# Patient Record
Sex: Female | Born: 1975 | Race: White | Hispanic: No | Marital: Single | Smoking: Current every day smoker
Health system: Southern US, Community
[De-identification: ages and names within clinical notes are randomized; demographics above are authoritative.]

## PROBLEM LIST (undated history)

## (undated) DIAGNOSIS — F419 Anxiety disorder, unspecified: Secondary | ICD-10-CM

## (undated) DIAGNOSIS — R32 Unspecified urinary incontinence: Secondary | ICD-10-CM

## (undated) DIAGNOSIS — F329 Major depressive disorder, single episode, unspecified: Secondary | ICD-10-CM

## (undated) DIAGNOSIS — M5430 Sciatica, unspecified side: Secondary | ICD-10-CM

## (undated) DIAGNOSIS — R600 Localized edema: Secondary | ICD-10-CM

## (undated) DIAGNOSIS — Z8759 Personal history of other complications of pregnancy, childbirth and the puerperium: Secondary | ICD-10-CM

## (undated) DIAGNOSIS — F32A Depression, unspecified: Secondary | ICD-10-CM

## (undated) DIAGNOSIS — G4700 Insomnia, unspecified: Secondary | ICD-10-CM

## (undated) HISTORY — DX: Insomnia, unspecified: G47.00

## (undated) HISTORY — DX: Localized edema: R60.0

## (undated) HISTORY — DX: Anxiety disorder, unspecified: F41.9

## (undated) HISTORY — DX: Depression, unspecified: F32.A

## (undated) HISTORY — DX: Major depressive disorder, single episode, unspecified: F32.9

## (undated) HISTORY — DX: Sciatica, unspecified side: M54.30

## (undated) HISTORY — PX: LAPAROSCOPIC GASTRIC BANDING: SHX1100

## (undated) HISTORY — DX: Personal history of other complications of pregnancy, childbirth and the puerperium: Z87.59

## (undated) HISTORY — DX: Unspecified urinary incontinence: R32

---
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
## 2010-01-22 ENCOUNTER — Encounter: Payer: Self-pay | Admitting: Pulmonary Disease

## 2010-01-26 ENCOUNTER — Encounter
Admission: RE | Admit: 2010-01-26 | Discharge: 2010-03-05 | Payer: Self-pay | Source: Home / Self Care | Admitting: Surgery

## 2010-02-03 ENCOUNTER — Ambulatory Visit (HOSPITAL_COMMUNITY): Admission: RE | Admit: 2010-02-03 | Discharge: 2010-02-03 | Payer: Self-pay | Admitting: Surgery

## 2010-02-04 ENCOUNTER — Ambulatory Visit (HOSPITAL_COMMUNITY): Admission: RE | Admit: 2010-02-04 | Discharge: 2010-02-04 | Payer: Self-pay | Admitting: Surgery

## 2010-02-25 ENCOUNTER — Ambulatory Visit (HOSPITAL_BASED_OUTPATIENT_CLINIC_OR_DEPARTMENT_OTHER): Admission: RE | Admit: 2010-02-25 | Discharge: 2010-02-25 | Payer: Self-pay | Admitting: Surgery

## 2010-02-25 ENCOUNTER — Encounter: Payer: Self-pay | Admitting: Pulmonary Disease

## 2010-02-28 ENCOUNTER — Ambulatory Visit: Payer: Self-pay | Admitting: Internal Medicine

## 2010-03-25 ENCOUNTER — Ambulatory Visit: Payer: Self-pay | Admitting: Surgery

## 2010-03-25 DIAGNOSIS — G2581 Restless legs syndrome: Secondary | ICD-10-CM

## 2010-03-25 DIAGNOSIS — G4733 Obstructive sleep apnea (adult) (pediatric): Secondary | ICD-10-CM | POA: Insufficient documentation

## 2010-03-25 DIAGNOSIS — J309 Allergic rhinitis, unspecified: Secondary | ICD-10-CM | POA: Insufficient documentation

## 2010-03-26 ENCOUNTER — Encounter
Admission: RE | Admit: 2010-03-26 | Discharge: 2010-06-16 | Payer: Self-pay | Source: Home / Self Care | Attending: Surgery | Admitting: Surgery

## 2010-04-07 ENCOUNTER — Ambulatory Visit (HOSPITAL_COMMUNITY): Admission: RE | Admit: 2010-04-07 | Discharge: 2010-04-08 | Payer: Self-pay | Admitting: Surgery

## 2010-04-08 ENCOUNTER — Ambulatory Visit: Payer: Self-pay | Admitting: Vascular Surgery

## 2010-04-08 ENCOUNTER — Encounter (INDEPENDENT_AMBULATORY_CARE_PROVIDER_SITE_OTHER): Payer: Self-pay | Admitting: Surgery

## 2010-04-15 ENCOUNTER — Encounter: Payer: Self-pay | Admitting: Pulmonary Disease

## 2010-04-21 ENCOUNTER — Encounter
Admission: RE | Admit: 2010-04-21 | Discharge: 2010-04-21 | Payer: Self-pay | Source: Home / Self Care | Attending: Surgery | Admitting: Surgery

## 2010-05-26 ENCOUNTER — Ambulatory Visit: Payer: Self-pay | Admitting: Pulmonary Disease

## 2010-07-07 NOTE — Assessment & Plan Note (Signed)
Summary: cpap setup, sleep study done at [facility]   Visit Type:  Initial Consult Copy to:  Dr. Luretha Murphy Primary Provider/Referring Provider:  Dr. Sindy Guadeloupe  CC:  Pt here for sleep consult.  History of Present Illness: 35 yo female for sleep evaluation.  She is being evaluated for lap band surgery.  During this evaluation she had a sleep study.  This showed  severe obstructive sleep apnea.  She followed a split night protocol.  She had very good response to CPAP with evidence for REM rebound.  She was titrated up to 20 cm H2O, but appeared to have reasonable control with CPAP at 15 cm H2O.  With CPAP at 15 cm H2O she was observed in REM and supine sleep.  She has not been started on CPAP yet.  She has noticed problem with her sleep for years.  She does snore, and has been told that she stops breathing while asleep.  She will sometimes wake up to find that she is sitting on the side of her bed gasping for air.  She always feels tired during the day.    She goes to bed at 9pm, and falls asleep quickly.  She wakes up several times during the night to use the bathroom.  She gets out of bed at 630 am, and feels tired still.  She will occasionally get headaches in the morning.  She drools a lot while asleep.  She will always talk in her sleep.  She clenches her teeth while asleep at times.  She has been using ambien intermittently, but this does not seem to help as much as before.    She has a history of restless legs syndrome.  She uses mirapex as needed.  This does not occur too often, but seemed to be more frequent when she was pregnant.  She denies sleep hallucinations, sleep paralysis, or cataplexy.  She denies nightmares, or sleep walking. She is not using anything to help her stay awake.  There is no history of thyroid disease.  Her mood has been okay.  She has been using celexa.  Epworth score is 16 out of 24.  Preventive Screening-Counseling & Management  Alcohol-Tobacco  Alcohol drinks/day: 0     Smoking Status: quit     Packs/Day: 1.0     Year Started: 1995     Year Quit: 2010  Current Medications (verified): 1)  Celexa 40 Mg Tabs (Citalopram Hydrobromide) .... Take 1 Tablet By Mouth Once A Day 2)  Robinul-Forte 2 Mg Tabs (Glycopyrrolate) .... Take 1 Tablet By Mouth Two Times A Day 3)  Ibuprofen 800 Mg Tabs (Ibuprofen) .... As Needed For Back Pain 4)  Ambien 5 Mg Tabs (Zolpidem Tartrate) .... Take 1 Tab By Mouth At Bedtime As Needed For Sleep  Allergies (verified): No Known Drug Allergies  Past History:  Past Medical History: Allergic Rhinitis Osteoarthritis Obesity Low back pain Depression Obstructive sleep apnea      - PSG 02/25/10>>AHI 127.5, CPAP 15 cm  Past Surgical History: Tonsillectomy 1990 Right knee 1993  Family History: Paternal family history unknown Family History Lung Cancer-maternal great grandmother Pancreatic cancer-maternal grandfather Mother - OSA  Social History: Single lives with parents and child.  Works as a Armed forces logistics/support/administrative officer.  Former smoker.  Smoking Status:  quit Packs/Day:  1.0 Alcohol drinks/day:  0  Review of Systems       The patient complains of shortness of breath with activity and hand/feet swelling.  The patient denies shortness  of breath at rest, productive cough, non-productive cough, coughing up blood, chest pain, irregular heartbeats, acid heartburn, indigestion, loss of appetite, weight change, abdominal pain, difficulty swallowing, sore throat, tooth/dental problems, headaches, nasal congestion/difficulty breathing through nose, sneezing, itching, ear ache, anxiety, depression, joint stiffness or pain, rash, change in color of mucus, and fever.    Vital Signs:  Patient profile:   35 year old female Height:      70 inches Weight:      383.6 pounds BMI:     55.24 O2 Sat:      98 % on Room air Temp:     98.4 degrees F oral Pulse rate:   83 / minute BP sitting:   124 / 72  (left arm) Cuff  size:   regular  Vitals Entered By: Zackery Barefoot CMA (March 25, 2010 2:33 PM)  O2 Flow:  Room air CC: Pt here for sleep consult Comments Medications reviewed with patient Verified contact number and pharmacy with patient Zackery Barefoot CMA  March 25, 2010 2:33 PM    Physical Exam  General:  normal appearance, healthy appearing, and obese.   Eyes:  PERRLA and EOMI.   Ears:  TMs intact and clear with normal canals Nose:  no deformity, discharge, inflammation, or lesions Mouth:  MP 3, no exudate Neck:  no JVD.   Chest Wall:  no deformities noted Lungs:  clear bilaterally to auscultation and percussion Heart:  regular rate and rhythm, S1, S2 without murmurs, rubs, gallops, or clicks Abdomen:  bowel sounds positive; abdomen soft and non-tender without masses, or organomegaly Msk:  no deformity or scoliosis noted with normal posture Pulses:  pulses normal Extremities:  no clubbing, cyanosis, edema, or deformity noted Neurologic:  normal CN II-XII and strength normal.   Skin:  no rashes Cervical Nodes:  no significant adenopathy Psych:  alert and cooperative; normal mood and affect; normal attention span and concentration   Impression & Recommendations:  Problem # 1:  OBSTRUCTIVE SLEEP APNEA (ICD-327.23) Her sleep study was reviewed with her.  I explained how sleep apnea can affect her health.  Driving precautions, and need for weight loss were discussed.  Treatment options were reviewed.  Given the severity of her sleep apnea, and her response to CPAP, I have recommended that she use CPAP.  Will start her on CPAP at 15 cm H2O and monitor for her clinical response.  Explained to her that her CPAP needs will change as she loses weight from lap band surgery.  Problem # 2:  MORBID OBESITY (ICD-278.01)  She is scheduled for lap band surgery in November.  She will need to have close monitoring of her oxygenation during the peri-operative period.  She will need to continue with  CPAP during the peri-operative period.  Pulmonary service can be available post-op if needed.  Problem # 3:  RESTLESS LEG SYNDROME (ICD-333.94)  She has mild symptoms, and uses as needed mirapex.  Will monitor for now.  Explained how her celexa could also be contributing to her leg symptoms.  Medications Added to Medication List This Visit: 1)  Celexa 40 Mg Tabs (Citalopram hydrobromide) .... Take 1 tablet by mouth once a day 2)  Robinul-forte 2 Mg Tabs (Glycopyrrolate) .... Take 1 tablet by mouth two times a day 3)  Ibuprofen 800 Mg Tabs (Ibuprofen) .... As needed for back pain 4)  Ambien 5 Mg Tabs (Zolpidem tartrate) .... Take 1 tab by mouth at bedtime as needed for sleep 5)  Mirapex  0.25 Mg Tabs (Pramipexole dihydrochloride) .... At bedtime as needed for legs  Complete Medication List: 1)  Celexa 40 Mg Tabs (Citalopram hydrobromide) .... Take 1 tablet by mouth once a day 2)  Robinul-forte 2 Mg Tabs (Glycopyrrolate) .... Take 1 tablet by mouth two times a day 3)  Ibuprofen 800 Mg Tabs (Ibuprofen) .... As needed for back pain 4)  Ambien 5 Mg Tabs (Zolpidem tartrate) .... Take 1 tab by mouth at bedtime as needed for sleep 5)  Mirapex 0.25 Mg Tabs (Pramipexole dihydrochloride) .... At bedtime as needed for legs  Other Orders: Consultation Level IV (16109) DME Referral (DME)  Patient Instructions: 1)  Will set up CPAP machine at 15 cm water pressure 2)  Good luck with surgery 3)  Follow up in 2 months   Immunization History:  Influenza Immunization History:    Influenza:  historical (03/02/2010)

## 2010-07-07 NOTE — Letter (Signed)
Summary: [facility]   Imported By: Sherian Rein 04/08/2010 11:30:46  _____________________________________________________________________  External Attachment:    Type:   Image     Comment:   External Document

## 2010-07-07 NOTE — Letter (Signed)
Summary: CMN for CPAP Supplies/[facility]  CMN for CPAP Supplies/[facility]   Imported By: Sherian Rein 04/20/2010 11:46:38  _____________________________________________________________________  External Attachment:    Type:   Image     Comment:   External Document

## 2010-08-18 LAB — DIFFERENTIAL
Eosinophils Absolute: 0 10*3/uL (ref 0.0–0.7)
Eosinophils Relative: 0 % (ref 0–5)
Lymphocytes Relative: 13 % (ref 12–46)
Lymphs Abs: 1.8 10*3/uL (ref 0.7–4.0)
Monocytes Relative: 7 % (ref 3–12)
Neutrophils Relative %: 80 % — ABNORMAL HIGH (ref 43–77)

## 2010-08-18 LAB — CBC
HCT: 37.8 % (ref 36.0–46.0)
Hemoglobin: 13 g/dL (ref 12.0–15.0)
MCH: 29.1 pg (ref 26.0–34.0)
MCHC: 34.5 g/dL (ref 30.0–36.0)
MCV: 84.5 fL (ref 78.0–100.0)
Platelets: 268 10*3/uL (ref 150–400)
RBC: 4.47 MIL/uL (ref 3.87–5.11)
RDW: 14.1 % (ref 11.5–15.5)
WBC: 13.7 10*3/uL — ABNORMAL HIGH (ref 4.0–10.5)

## 2010-08-18 LAB — HEMOGLOBIN AND HEMATOCRIT, BLOOD
HCT: 41.1 % (ref 36.0–46.0)
Hemoglobin: 14.3 g/dL (ref 12.0–15.0)

## 2010-08-19 LAB — CBC
HCT: 42.8 % (ref 36.0–46.0)
MCH: 28.9 pg (ref 26.0–34.0)
MCV: 83.8 fL (ref 78.0–100.0)
Platelets: 289 10*3/uL (ref 150–400)
RBC: 5.1 MIL/uL (ref 3.87–5.11)
WBC: 11.5 10*3/uL — ABNORMAL HIGH (ref 4.0–10.5)

## 2010-08-19 LAB — COMPREHENSIVE METABOLIC PANEL
Alkaline Phosphatase: 62 U/L (ref 39–117)
BUN: 14 mg/dL (ref 6–23)
Chloride: 101 mEq/L (ref 96–112)
Creatinine, Ser: 0.64 mg/dL (ref 0.4–1.2)
GFR calc non Af Amer: >60 mL/min (ref >60)
Glucose, Bld: 66 mg/dL — ABNORMAL LOW (ref 70–99)
Potassium: 4.4 mEq/L (ref 3.5–5.1)
Total Bilirubin: 0.4 mg/dL (ref 0.3–1.2)

## 2010-08-19 LAB — PREGNANCY, URINE: Preg Test, Ur: NEGATIVE

## 2010-08-19 LAB — DIFFERENTIAL
Basophils Absolute: 0 10*3/uL (ref 0.0–0.1)
Basophils Relative: 0 % (ref 0–1)
Lymphocytes Relative: 20 % (ref 12–46)
Neutro Abs: 8.4 10*3/uL — ABNORMAL HIGH (ref 1.7–7.7)
Neutrophils Relative %: 73 % (ref 43–77)

## 2010-08-19 LAB — SURGICAL PCR SCREEN
MRSA, PCR: NEGATIVE
Staphylococcus aureus: POSITIVE — AB

## 2010-10-27 ENCOUNTER — Encounter (INDEPENDENT_AMBULATORY_CARE_PROVIDER_SITE_OTHER): Payer: Self-pay | Admitting: Surgery

## 2011-01-15 ENCOUNTER — Encounter (INDEPENDENT_AMBULATORY_CARE_PROVIDER_SITE_OTHER): Payer: Self-pay

## 2011-01-15 ENCOUNTER — Ambulatory Visit (INDEPENDENT_AMBULATORY_CARE_PROVIDER_SITE_OTHER): Payer: BC Managed Care – PPO | Admitting: Physician Assistant

## 2011-01-15 VITALS — BP 138/86 | Ht 70.0 in | Wt 320.0 lb

## 2011-01-15 DIAGNOSIS — Z4651 Encounter for fitting and adjustment of gastric lap band: Secondary | ICD-10-CM

## 2011-01-15 NOTE — Patient Instructions (Signed)
Take clear liquids for the next 48 hours. Thin protein shakes are ok to start on Saturday evening. Call us if you have persistent vomiting or regurgitation, night cough or reflux symptoms. Return as scheduled or sooner if you notice no changes in hunger/portion sizes.   

## 2011-01-15 NOTE — Progress Notes (Signed)
  HISTORY: Amanda Fitzpatrick is a 35 y.o.female who received an AP-Large lap-band in November 2011 by Dr. Daphine Deutscher. She comes in today with increased hunger and portion sizes. She has no complaints of obstruction.  VITAL SIGNS: Filed Vitals:   01/15/11 1636  BP: 138/86    PHYSICAL EXAM: Physical exam reveals a very well-appearing 35 y.o.female in no apparent distress Neurologic: Awake, alert, oriented Psych: Bright affect, conversant Respiratory: Breathing even and unlabored. No stridor or wheezing Abdomen: Soft, nontender, nondistended to palpation. Incisions well-healed. No incisional hernias. Port easily palpated. Extremities: Atraumatic, good range of motion.  ASSESMENT: 35 y.o.  female  s/p AP-Large lap-band.   PLAN: The patient's port was accessed with a 20G Huber needle without difficulty. Clear fluid was aspirated and 0.5 mL saline was added to the port. The patient was able to swallow water without difficulty following the procedure and was instructed to take clear liquids for the next 24-48 hours and advance slowly as tolerated.

## 2011-02-19 ENCOUNTER — Encounter (INDEPENDENT_AMBULATORY_CARE_PROVIDER_SITE_OTHER): Payer: BC Managed Care – PPO

## 2011-02-26 ENCOUNTER — Ambulatory Visit (INDEPENDENT_AMBULATORY_CARE_PROVIDER_SITE_OTHER): Payer: BC Managed Care – PPO | Admitting: Physician Assistant

## 2011-02-26 ENCOUNTER — Encounter (INDEPENDENT_AMBULATORY_CARE_PROVIDER_SITE_OTHER): Payer: Self-pay

## 2011-02-26 VITALS — BP 116/74 | HR 74 | Temp 96.2°F | Resp 14 | Ht 70.0 in | Wt 313.2 lb

## 2011-02-26 DIAGNOSIS — Z4651 Encounter for fitting and adjustment of gastric lap band: Secondary | ICD-10-CM

## 2011-02-26 NOTE — Progress Notes (Signed)
  HISTORY: Amanda Fitzpatrick is a 35 y.o.female who received an AP-Large lap-band in November 2011 by Dr. Daphine Deutscher. She has no complaints of regurgitation or reflux but about twice a month has a burning sensation in the epigastrium during eating which self-resolves. She still finds herself hungry and wanting smaller portion sizes.  VITAL SIGNS: Filed Vitals:   02/26/11 1636  BP: 116/74  Pulse: 74  Temp: 96.2 F (35.7 C)  Resp: 14    PHYSICAL EXAM: Physical exam reveals a very well-appearing 35 y.o.female in no apparent distress Neurologic: Awake, alert, oriented Psych: Bright affect, conversant Respiratory: Breathing even and unlabored. No stridor or wheezing Abdomen: Soft, nontender, nondistended to palpation. Incisions well-healed. No incisional hernias. Port easily palpated. Extremities: Atraumatic, good range of motion.  ASSESMENT: 35 y.o.  female  s/p AP-Large lap-band.   PLAN: The patient's port was accessed with a 20G Huber needle without difficulty. Clear fluid was aspirated and 0.5 mL saline was added to the port. The patient was able to swallow water without difficulty following the procedure and was instructed to take clear liquids for the next 24-48 hours and advance slowly as tolerated.

## 2011-02-26 NOTE — Patient Instructions (Signed)
Take clear liquids for the next 48 hours. Thin protein shakes are ok to start on Saturday evening. Call us if you have persistent vomiting or regurgitation, night cough or reflux symptoms. Return as scheduled or sooner if you notice no changes in hunger/portion sizes.   

## 2011-04-01 ENCOUNTER — Encounter (INDEPENDENT_AMBULATORY_CARE_PROVIDER_SITE_OTHER): Payer: BC Managed Care – PPO | Admitting: General Surgery

## 2011-04-02 ENCOUNTER — Encounter (INDEPENDENT_AMBULATORY_CARE_PROVIDER_SITE_OTHER): Payer: BC Managed Care – PPO

## 2011-04-14 ENCOUNTER — Encounter (INDEPENDENT_AMBULATORY_CARE_PROVIDER_SITE_OTHER): Payer: Self-pay | Admitting: Physician Assistant

## 2011-05-25 ENCOUNTER — Telehealth (INDEPENDENT_AMBULATORY_CARE_PROVIDER_SITE_OTHER): Payer: Self-pay | Admitting: Surgery

## 2011-05-25 NOTE — Telephone Encounter (Signed)
05/25/11 recall mailed to patient for bariatric surgery follow-up. Adv pt to call [facility] to schedule an appt.cef °

## 2011-12-02 ENCOUNTER — Encounter (INDEPENDENT_AMBULATORY_CARE_PROVIDER_SITE_OTHER): Payer: Self-pay | Admitting: Physician Assistant

## 2011-12-02 ENCOUNTER — Ambulatory Visit (INDEPENDENT_AMBULATORY_CARE_PROVIDER_SITE_OTHER): Payer: Self-pay | Admitting: Physician Assistant

## 2011-12-02 VITALS — BP 128/88 | HR 64 | Temp 97.8°F | Resp 16 | Ht 70.0 in | Wt 296.6 lb

## 2011-12-02 DIAGNOSIS — Z4651 Encounter for fitting and adjustment of gastric lap band: Secondary | ICD-10-CM

## 2011-12-02 NOTE — Progress Notes (Signed)
  HISTORY: Illona Fitzpatrick is a 36 y.o.female who received an AP-Large lap-band in November 2011 by Dr. Daphine Deutscher. She has lost 17 lbs in the past 9 months. She has noticed an increase in her hunger and portion sizes but no persistent regurgitation or reflux. She would like an adjustment.  VITAL SIGNS: Filed Vitals:   12/02/11 0851  BP: 128/88  Pulse: 64  Temp: 97.8 F (36.6 C)  Resp: 16    PHYSICAL EXAM: Physical exam reveals a very well-appearing 36 y.o.female in no apparent distress Neurologic: Awake, alert, oriented Psych: Bright affect, conversant Respiratory: Breathing even and unlabored. No stridor or wheezing Abdomen: Soft, nontender, nondistended to palpation. Incisions well-healed. No incisional hernias. Port easily palpated. Extremities: Atraumatic, good range of motion.  ASSESMENT: 36 y.o.  female  s/p AP-Large lap-band.   PLAN: The patient's port was accessed with a 20G Huber needle without difficulty. Clear fluid was aspirated and 0.5 mL saline was added to the port. The patient was able to swallow water without difficulty following the procedure and was instructed to take clear liquids for the next 24-48 hours and advance slowly as tolerated.

## 2011-12-02 NOTE — Patient Instructions (Signed)
Take clear liquids tonight. Thin protein shakes are ok to start tomorrow morning. Slowly advance your diet thereafter. Call us if you have persistent vomiting or regurgitation, night cough or reflux symptoms. Return as scheduled or sooner if you notice no changes in hunger/portion sizes.  

## 2011-12-15 ENCOUNTER — Telehealth: Payer: Self-pay | Admitting: Obstetrics and Gynecology

## 2011-12-16 NOTE — Telephone Encounter (Signed)
No encounter

## 2011-12-23 ENCOUNTER — Encounter (INDEPENDENT_AMBULATORY_CARE_PROVIDER_SITE_OTHER): Payer: Self-pay

## 2012-05-22 IMAGING — CR DG UGI W/ KUB
2 series · 2 of 2 positions shown · non-contrast
Comparison: None available.

CLINICAL DATA: Preop screening for bariatric surgery

UPPER GI SERIES W/ KUB
TECHNIQUE: Limited exam done with the patient standing upright due
to large body habitus.

[view not recorded (1 of 2)]
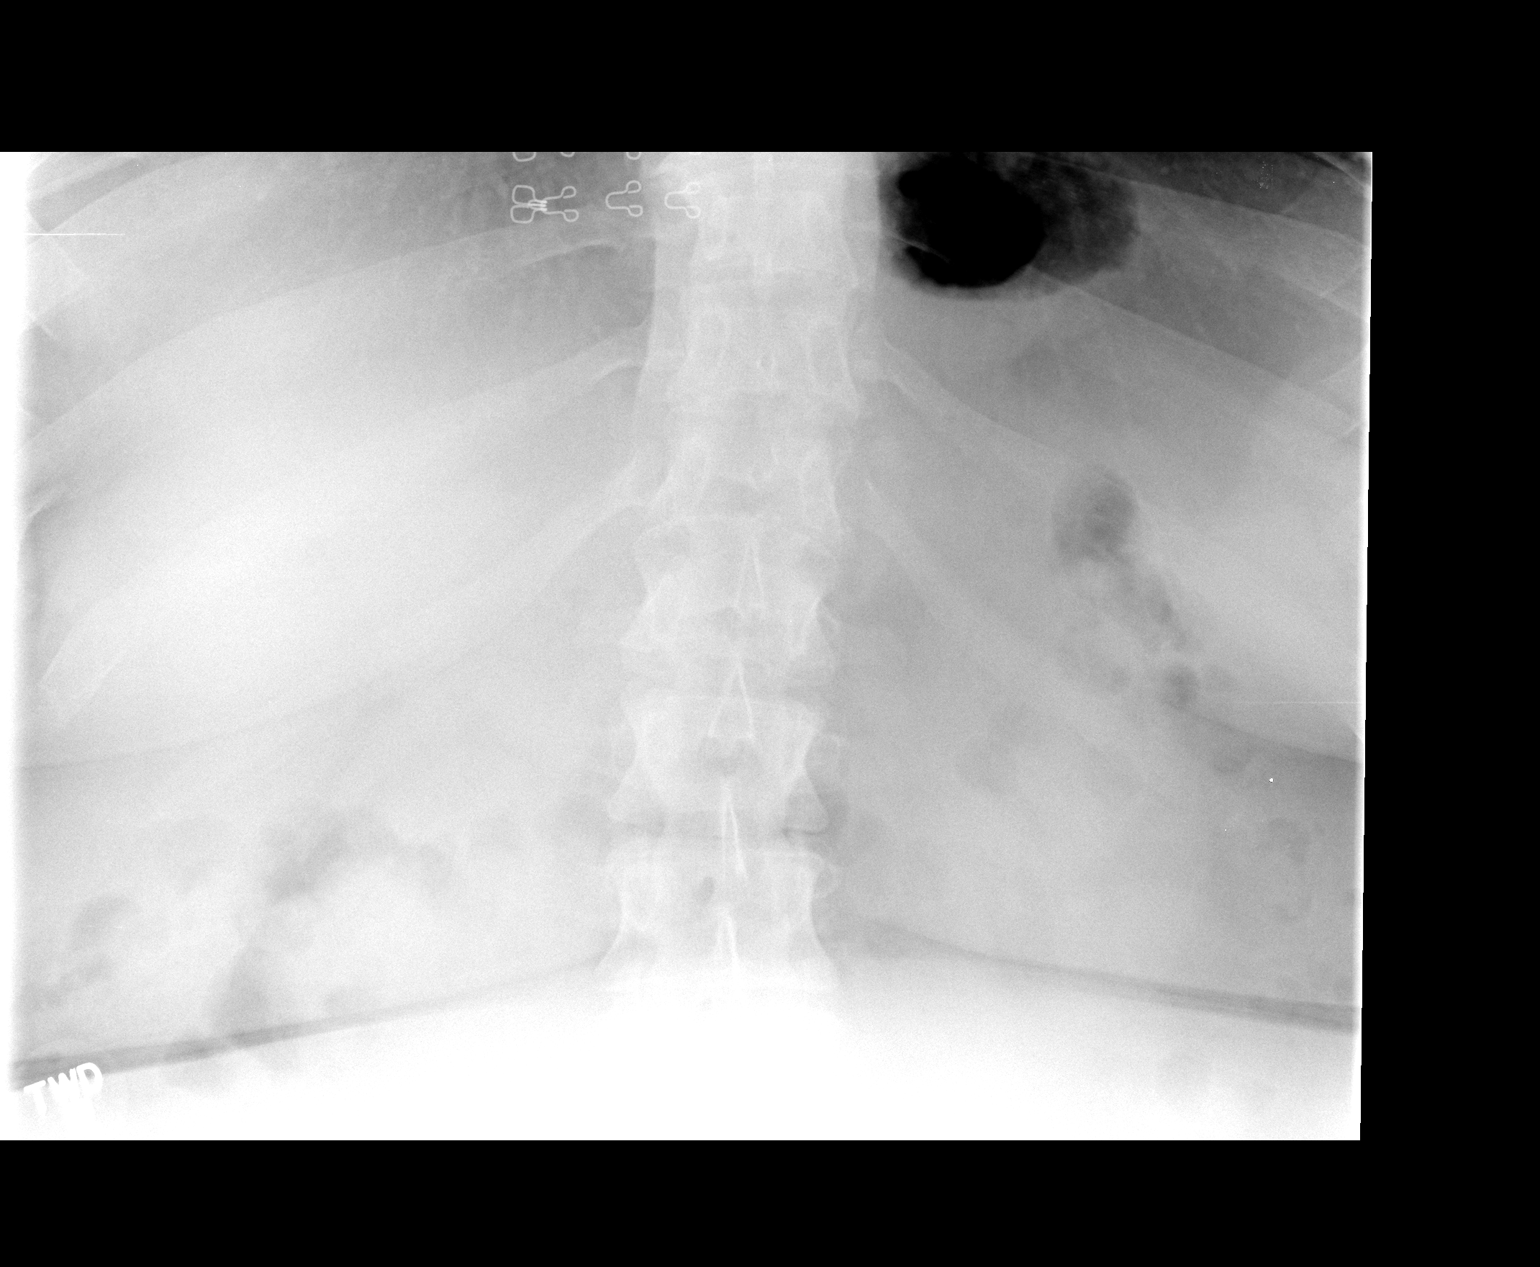

[view not recorded (2 of 2)]
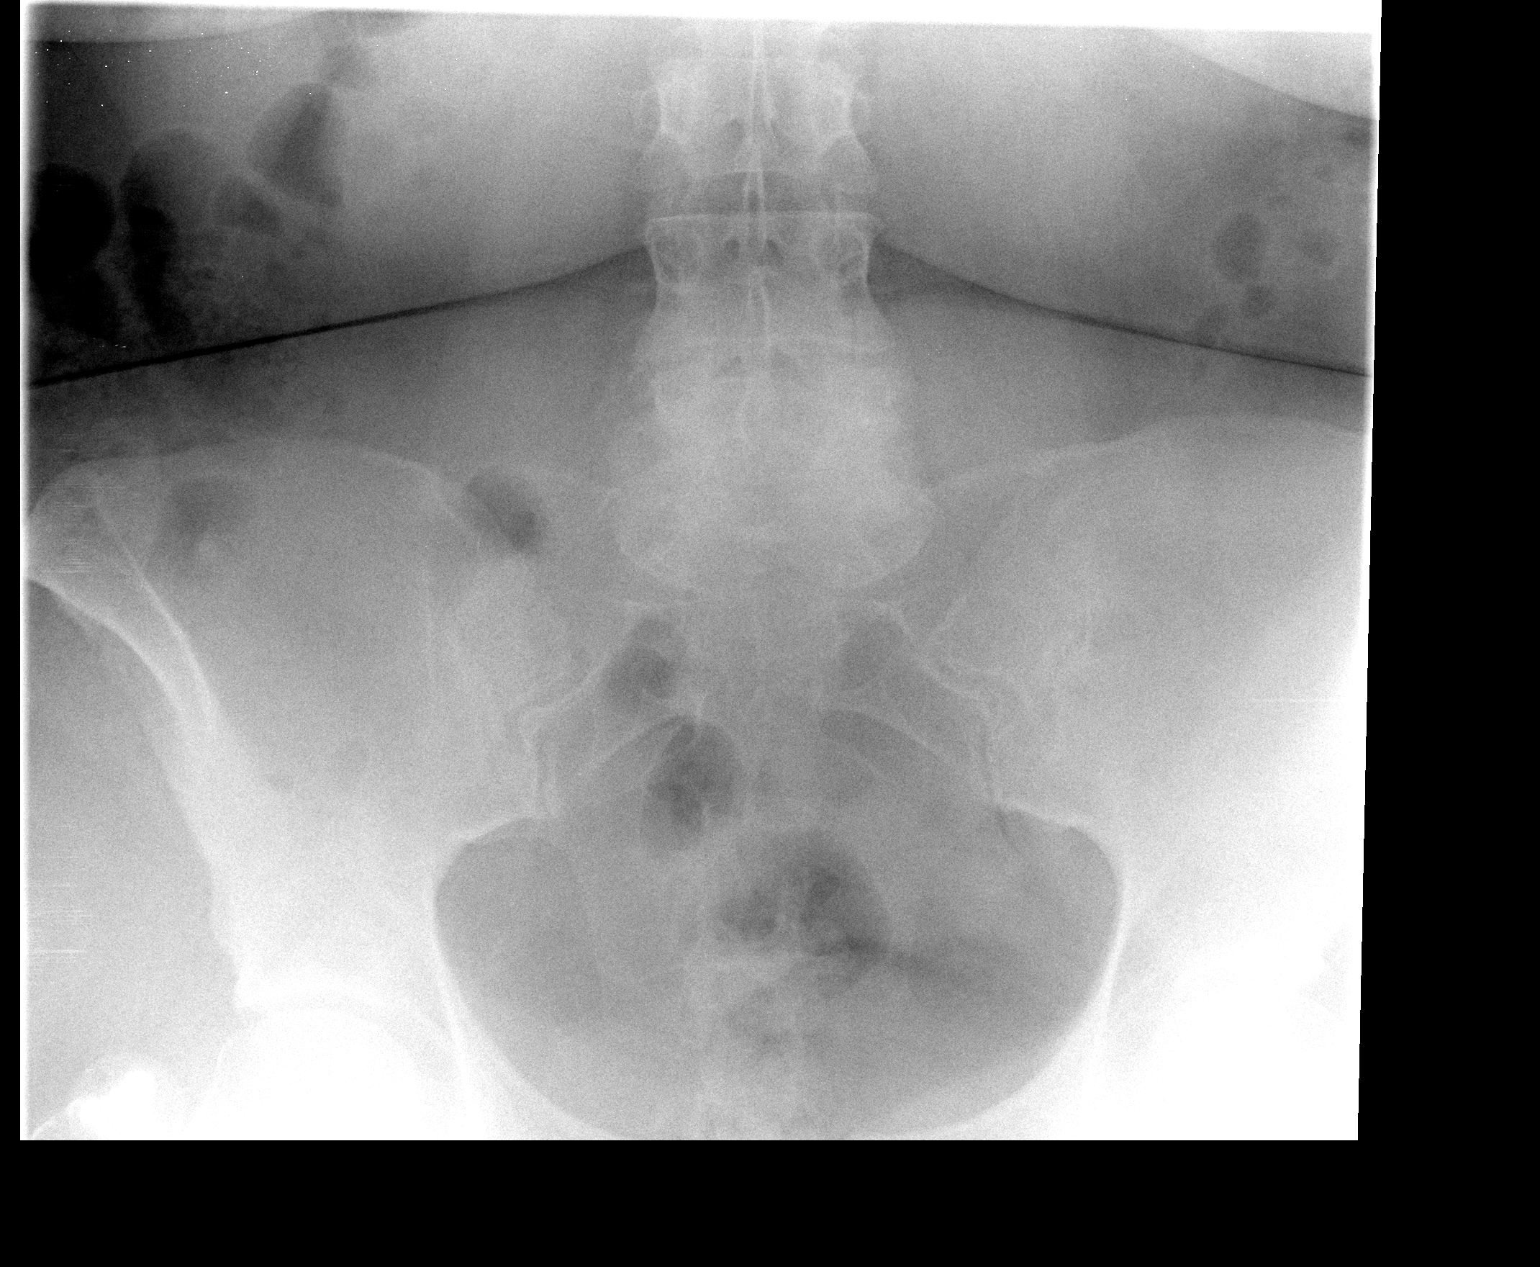

[2 of 2 positions shown; findings below may reference images not displayed]

FINDINGS: Upright scout view unremarkable.  Swallowing mechanism
normal.  Probable small hiatal hernia.  No stricture, esophagitis
or reflux.

Stomach, duodenal bulb, and proximal small bowel visualized are
normal.  Ligament of Treitz anatomical.
IMPRESSION: 1.  Limited study done with the patient upright.
2.  Possible small hiatal hernia without stricture or esophagitis.
3.  Stomach and proximal small bowel normal.

## 2012-07-25 IMAGING — CR DG ABDOMEN 1V
1 series · 1 of 1 positions shown · non-contrast
Comparison: Imaging from upper GI series dated 02/03/2010

CLINICAL DATA: Endoscopic gastric banding surgery.

ABDOMEN - 1 VIEW

[t abdomen supine *]
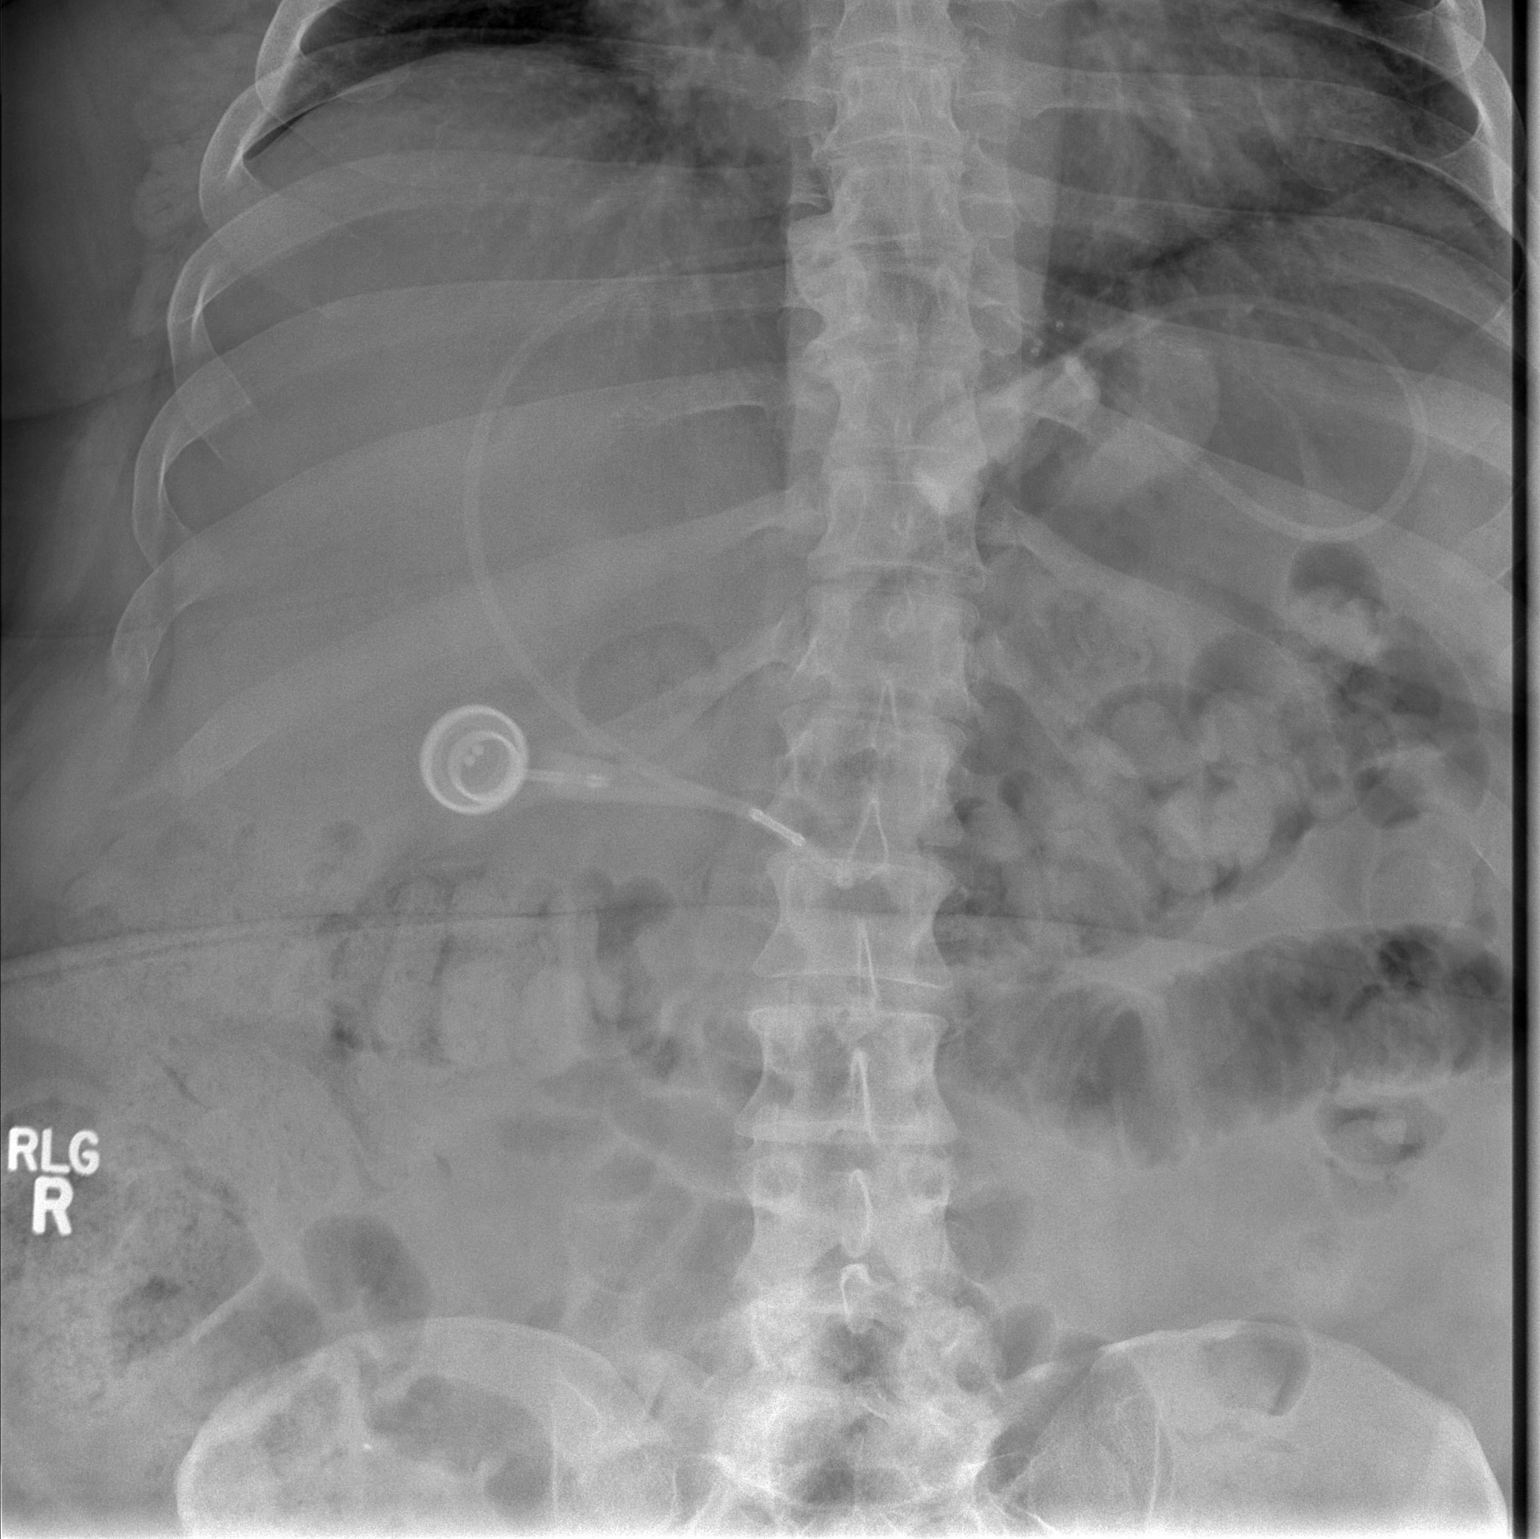

[1 of 1 positions shown; findings below may reference images not displayed]

FINDINGS: Gastric band present with appropriate positioning
spanning from the 2 o'clock to 7 o'clock position.  Attached tubing
and subcutaneous port show normal appearance.  Underlying bowel gas
pattern shows no obstruction.  There is one mildly prominent small
bowel loop in the left mid abdomen.
IMPRESSION: Status post gastric banding with appropriate positioning of the
band from the 2 o'clock to 7 o'clock position.

## 2013-07-12 ENCOUNTER — Ambulatory Visit (INDEPENDENT_AMBULATORY_CARE_PROVIDER_SITE_OTHER): Payer: Self-pay | Admitting: Physician Assistant

## 2013-07-12 ENCOUNTER — Encounter (INDEPENDENT_AMBULATORY_CARE_PROVIDER_SITE_OTHER): Payer: Self-pay

## 2013-07-12 VITALS — BP 110/70 | HR 64 | Temp 98.6°F | Resp 14 | Ht 70.0 in | Wt 224.8 lb

## 2013-07-12 DIAGNOSIS — Z4651 Encounter for fitting and adjustment of gastric lap band: Secondary | ICD-10-CM

## 2013-07-12 NOTE — Patient Instructions (Addendum)
Return after your delivery. Focus on good food choices as well as physical activity. Return sooner if you have an increase in hunger, portion sizes or weight. Return also for difficulty swallowing, night cough, reflux. If you still have difficulty swallowing solid food despite today's actions, call our office right away for an appointment.

## 2013-07-12 NOTE — Progress Notes (Signed)
  HISTORY: Amanda NiemannJennifer Fitzpatrick is a 38 y.o.female who received an AP-Large lap-band in November 2011 by Dr. Daphine DeutscherMartin. She comes in with 72 lbs weight loss since her last visit in June 2013. Unfortunately she's been having solid food dysphagia for quite some time, rendering her on a mostly liquid diet. She cites financial issues as the reason for not coming in. She is now [redacted] weeks pregnant and is wanting fluid out.  VITAL SIGNS: Filed Vitals:   07/12/13 1208  BP: 110/70  Pulse: 64  Temp: 98.6 F (37 C)  Resp: 14    PHYSICAL EXAM: Physical exam reveals a very well-appearing 38 y.o.female in no apparent distress Neurologic: Awake, alert, oriented Psych: Bright affect, conversant Respiratory: Breathing even and unlabored. No stridor or wheezing Abdomen: Soft, nontender, nondistended to palpation. Incisions well-healed. No incisional hernias. Port easily palpated. Extremities: Atraumatic, good range of motion.  ASSESMENT: 38 y.o.  female  s/p AP-Large lap-band.   PLAN: The patient's port was accessed with a 20G Huber needle without difficulty. Clear fluid was aspirated and 8.5 mL saline was removed from the port to give a total predicted volume of 0 mL. The patient was advised to concentrate on healthy food choices and to avoid slider foods high in fats and carbohydrates. I asked her to call us if she has persistent dysphagia despite today's actions. Otherwise I asked her to return after delivery to get back on track, warning her that weight gain is expected and desired during pregnancy but not to the extreme. She voiced understanding and agreement.

## 2013-12-20 ENCOUNTER — Ambulatory Visit (INDEPENDENT_AMBULATORY_CARE_PROVIDER_SITE_OTHER): Payer: Self-pay | Admitting: Physician Assistant

## 2013-12-20 ENCOUNTER — Encounter (INDEPENDENT_AMBULATORY_CARE_PROVIDER_SITE_OTHER): Payer: Self-pay

## 2013-12-20 VITALS — BP 126/82 | HR 72 | Temp 98.6°F | Resp 14 | Ht 70.0 in | Wt 284.0 lb

## 2013-12-20 DIAGNOSIS — Z4651 Encounter for fitting and adjustment of gastric lap band: Secondary | ICD-10-CM

## 2013-12-20 NOTE — Progress Notes (Signed)
  HISTORY: Amanda NiemannJennifer Fitzpatrick is a 38 y.o.female who received an AP-Large lap-band in November 2011 by Dr. Daphine DeutscherMartin. The patient has gained 60 lbs since their last visit in February, and has lost 97 lbs since surgery. She was last seen at [redacted] weeks gestation and all fluid was removed for her pregnancy. Unfortunately, she miscarried five weeks later. She is now wanting to get back on track with weight loss as she was down a total of 156 lbs at the outset of her pregnancy.  VITAL SIGNS: Filed Vitals:   12/20/13 1622  BP: 126/82  Pulse: 72  Temp: 98.6 F (37 C)  Resp: 14    PHYSICAL EXAM: Physical exam reveals a very well-appearing 38 y.o.female in no apparent distress Neurologic: Awake, alert, oriented Psych: Bright affect, conversant Respiratory: Breathing even and unlabored. No stridor or wheezing Abdomen: Soft, nontender, nondistended to palpation. Incisions well-healed. No incisional hernias. Port easily palpated. Extremities: Atraumatic, good range of motion.  ASSESMENT: 38 y.o.  female  s/p AP-Large lap-band.   PLAN: The patient's port was accessed with a 20G Huber needle without difficulty. Clear fluid was aspirated and 3 mL saline was added to the port to give a total predicted volume of 3 mL. The patient was able to swallow water without difficulty following the procedure and was instructed to take clear liquids for the next 24-48 hours and advance slowly as tolerated. I've asked her to return in one month.

## 2013-12-20 NOTE — Patient Instructions (Signed)

## 2014-01-17 ENCOUNTER — Ambulatory Visit (INDEPENDENT_AMBULATORY_CARE_PROVIDER_SITE_OTHER): Payer: Self-pay | Admitting: Physician Assistant

## 2014-01-17 ENCOUNTER — Encounter (INDEPENDENT_AMBULATORY_CARE_PROVIDER_SITE_OTHER): Payer: Self-pay

## 2014-01-17 VITALS — BP 126/76 | HR 80 | Ht 70.0 in | Wt 291.8 lb

## 2014-01-17 DIAGNOSIS — Z4651 Encounter for fitting and adjustment of gastric lap band: Secondary | ICD-10-CM

## 2014-01-17 NOTE — Progress Notes (Signed)
  HISTORY: Amanda NiemannJennifer Schindler is a 38 y.o.female who received an AP-Large lap-band in November 2011 by Dr. Daphine DeutscherMartin. The patient has gained 8 lbs since their last visit in July, and has lost 89 lbs since surgery. She is having progressive fills since a band vacation due to pregnancy. She has 3 mL in the band today but is still feeling hungry and eating more than desired. No regurgitation or reflux symptoms are reported.  VITAL SIGNS: Filed Vitals:   01/17/14 1020  BP: 126/76  Pulse: 80    PHYSICAL EXAM: Physical exam reveals a very well-appearing 38 y.o.female in no apparent distress Neurologic: Awake, alert, oriented Psych: Bright affect, conversant Respiratory: Breathing even and unlabored. No stridor or wheezing Abdomen: Soft, nontender, nondistended to palpation. Incisions well-healed. No incisional hernias. Port easily palpated. Extremities: Atraumatic, good range of motion.  ASSESMENT: 38 y.o.  female  s/p AP-Large lap-band.   PLAN: The patient's port was accessed with a 20G Huber needle without difficulty. Clear fluid was aspirated and 2 mL saline was added to the port to give a total predicted volume of 5 mL. This was done with a dynamic fill technique. The patient was able to swallow water without difficulty following the procedure and was instructed to take clear liquids for the next 24-48 hours and advance slowly as tolerated. She will return in 2 months or sooner if needed.

## 2014-01-17 NOTE — Patient Instructions (Signed)

## 2014-03-21 ENCOUNTER — Encounter (INDEPENDENT_AMBULATORY_CARE_PROVIDER_SITE_OTHER): Payer: Self-pay

## 2016-03-12 ENCOUNTER — Encounter (HOSPITAL_COMMUNITY): Payer: Self-pay

## 2016-11-03 ENCOUNTER — Encounter (HOSPITAL_COMMUNITY): Payer: Self-pay

## 2021-03-10 ENCOUNTER — Other Ambulatory Visit: Payer: Self-pay | Admitting: Student

## 2021-03-10 DIAGNOSIS — R1310 Dysphagia, unspecified: Secondary | ICD-10-CM

## 2021-03-10 DIAGNOSIS — Z9884 Bariatric surgery status: Secondary | ICD-10-CM

## 2024-02-29 NOTE — Progress Notes (Signed)
 This encounter was created in error - please disregard.
